# Patient Record
Sex: Male | Born: 1980 | Race: White | Hispanic: No | Marital: Single | Smoking: Current every day smoker
Health system: Southern US, Community
[De-identification: ages and names within clinical notes are randomized; demographics above are authoritative.]

## PROBLEM LIST (undated history)

## (undated) HISTORY — PX: TONSILLECTOMY: SUR1361

## (undated) HISTORY — PX: APPENDECTOMY: SHX54

---

## 2015-06-13 ENCOUNTER — Encounter: Payer: Self-pay | Admitting: Emergency Medicine

## 2015-06-13 ENCOUNTER — Emergency Department
Admission: EM | Admit: 2015-06-13 | Discharge: 2015-06-13 | Disposition: A | Discharge status: Home / Self Care | Payer: BLUE CROSS/BLUE SHIELD | Attending: Emergency Medicine | Admitting: Emergency Medicine

## 2015-06-13 DIAGNOSIS — K644 Residual hemorrhoidal skin tags: Secondary | ICD-10-CM | POA: Diagnosis not present

## 2015-06-13 DIAGNOSIS — Z880 Allergy status to penicillin: Secondary | ICD-10-CM | POA: Insufficient documentation

## 2015-06-13 DIAGNOSIS — Z720 Tobacco use: Secondary | ICD-10-CM | POA: Diagnosis not present

## 2015-06-13 DIAGNOSIS — K649 Unspecified hemorrhoids: Secondary | ICD-10-CM

## 2015-06-13 DIAGNOSIS — K6289 Other specified diseases of anus and rectum: Secondary | ICD-10-CM | POA: Diagnosis present

## 2015-06-13 MED ORDER — LIDOCAINE VISCOUS 2 % MT SOLN
15.0000 mL | Freq: Once | OROMUCOSAL | Status: AC
  Administered 2015-06-13: 15 mL via OROMUCOSAL
  Filled 2015-06-13: qty 15

## 2015-06-13 MED ORDER — HYDROMORPHONE HCL 1 MG/ML IJ SOLN
1.0000 mg | Freq: Once | INTRAMUSCULAR | Status: AC
  Administered 2015-06-13: 1 mg via INTRAMUSCULAR
  Filled 2015-06-13: qty 1

## 2015-06-13 MED ORDER — OXYCODONE-ACETAMINOPHEN 5-325 MG PO TABS
2.0000 | ORAL_TABLET | Freq: Once | ORAL | Status: AC
  Administered 2015-06-13: 2 via ORAL
  Filled 2015-06-13: qty 2

## 2015-06-13 MED ORDER — OXYCODONE-ACETAMINOPHEN 5-325 MG PO TABS: 1.0000 | ORAL_TABLET | Freq: Four times a day (QID) | ORAL | Status: AC | PRN

## 2015-06-13 MED ORDER — LIDOCAINE VISCOUS 2 % MT SOLN: 20.0000 mL | OROMUCOSAL | Status: AC | PRN

## 2015-06-13 NOTE — Discharge Instructions (Signed)
Hemorrhoidectomy Hemorrhoidectomy is surgery to remove hemorrhoids. Hemorrhoids are veins that have become swollen in the rectum. The rectum is the area from the bottom end of the intestines to the opening where bowel movements leave the body. Hemorrhoids can be uncomfortable. They can cause itching, bleeding and pain if a blood clot forms in them (thrombose). If hemorrhoids are small, surgery may not be needed. But if they cover a larger area, surgery is usually suggested.  LET YOUR CAREGIVER KNOW ABOUT:   Any allergies.  All medications you are taking, including:  Herbs, eyedrops, over-the-counter medications and creams.  Blood thinners (anticoagulants), aspirin or other drugs that could affect blood clotting.  Use of steroids (by mouth or as creams).  Previous problems with anesthetics, including local anesthetics.  Possibility of pregnancy, if this applies.  Any history of blood clots.  Any history of bleeding or other blood problems.  Previous surgery.  Smoking history.  Other health problems. RISKS AND COMPLICATIONS All surgery carries some risk. However, hemorrhoid surgery usually goes smoothly. Possible complications could include:  Urinary retention.  Bleeding.  Infection.  A painful incision.  A reaction to the anesthesia (this is not common). BEFORE THE PROCEDURE   Stop using aspirin and non-steroidal anti-inflammatory drugs (NSAIDs) for pain relief. This includes prescription drugs and over-the-counter drugs such as ibuprofen and naproxen. Also stop taking vitamin E. If possible, do this two weeks before your surgery.  If you take blood-thinners, ask your healthcare provider when you should stop taking them.  You will probably have blood and urine tests done several days before your surgery.  Do not eat or drink for about 8 hours before the surgery.  Arrive at least an hour before the surgery, or whenever your surgeon recommends. This will give you time to  check in and fill out any needed paperwork.  Hemorrhoidectomy is often an outpatient procedure. This means you will be able to go home the same day. Sometimes, though, people stay overnight in the hospital after the procedure. Ask your surgeon what to expect. Either way, make arrangements in advance for someone to drive you home. PROCEDURE   The preparation:  You will change into a hospital gown.  You will be given an IV. A needle will be inserted in your arm. Medication can flow directly into your body through this needle.  You might be given an enema to clear your rectum.  Once in the operating room, you will probably lie on your side or be repositioned later to lying on your stomach.  You will be given anesthesia (medication) so you will not feel anything during the surgery. The surgery often is done with local anesthesia (the area near the hemorrhoids will be numb and you will be drowsy but awake). Sometimes, general anesthesia is used (you will be asleep during the procedure).  The procedure:  There are a few different procedures for hemorrhoids. Be sure to ask you surgeon about the procedure, the risks and benefits.  Be sure to ask about what you need to do to take care of the wound, if there is one. AFTER THE PROCEDURE  You will stay in a recovery area until the anesthesia has worn off. Your blood pressure and pulse will be checked every so often.  You may feel a lot of pain in the area of the rectum.  Take all pain medication prescribed by your surgeon. Ask before taking any over-the-counter pain medicines.  Sometimes sitting in a warm bath can help relieve  your pain.  To make sure you have bowel movements without straining:  You will probably need to take stool softeners (usually a pill) for a few days.  You should drink 8 to 10 glasses of water each day.  Your activity will be restricted for awhile. Ask your caregiver for a list of what you should and should not do  while you recover. Document Released: 08/17/2009 Document Revised: 01/12/2012 Document Reviewed: 08/17/2009 [facility] Patient Information 2015 [place], [place]. This information is not intended to replace advice given to you by your health care provider. Make sure you discuss any questions you have with your health care provider.  Hemorrhoid Banding Hemorrhoids are veins in the anus and lower rectum that become enlarged. The most common symptoms are rectal bleeding, itching, and sometimes pain. Hemorrhoids might come out with straining or having a bowel movement, and they can sometimes be pushed back in. There are internal and external hemorrhoids. Only internal hemorrhoids can be treated with banding. In this procedure, a rubber band is placed near the hemorrhoid tissue, cutting off the blood supply. This procedure prevents the hemorrhoids from slipping down. LET YOUR CAREGIVER KNOW ABOUT: All medicines you are taking, especially blood thinners such as aspirin and coumadin.  RISKS AND COMPLICATIONS This is not a painful procedure, but if you do have intense pain immediately let your surgeon know because the band may need to be removed. You may have some mild pain or discomfort in the first 2 days or so after treatment. Sometimes there may be delayed bleeding in the first week after treatment.  BEFORE THE PROCEDURE  There is no special preparation needed before banding. Your surgeon may have you do an enema prior to the procedure. You will go home the same day.  HOME CARE INSTRUCTIONS   Your surgeon might instruct you to do sitz baths as needed if you have discomfort or after a bowel movement.  You may be instructed to use fiber supplements. SEEK MEDICAL CARE IF:  You have an increase in pain.  Your pain does not get better. SEEK IMMEDIATE MEDICAL CARE IF:  You have intense pain.  Fever greater than 100.5 F (38.1 C).  Bleeding that does not stop, or pus from the anus. Document Released:  08/17/2009 Document Revised: 01/12/2012 Document Reviewed: 08/17/2009 [facility] Patient Information 2015 [place], [place]. This information is not intended to replace advice given to you by your health care provider. Make sure you discuss any questions you have with your health care provider.

## 2015-06-13 NOTE — ED Provider Notes (Addendum)
[facility] Emergency Department Provider Note     Time seen: ----------------------------------------- 7:45 AM on 06/13/2015 -----------------------------------------    I have reviewed the triage vital signs and the nursing notes.   HISTORY  Chief Complaint Rectal Pain    HPI Thomas Khan is a 34 y.o. male who presents ER stating he has hemorrhoids cut out Monday at [facility]. Has developed severe rectal pain and groin pain this morning. Patient states she doesn't have any pain medicine because he didn't prescribe him any. Denies fevers chills or other complaints. Hurts to sit down   History reviewed. No pertinent past medical history.  There are no active problems to display for this patient.   Past Surgical History  Procedure Laterality Date  . Appendectomy    . Tonsillectomy      Allergies Penicillins  Social History Social History  Substance Use Topics  . Smoking status: Current Every Day Smoker  . Smokeless tobacco: None  . Alcohol Use: Yes    Review of Systems Constitutional: Negative for fever. Eyes: Negative for visual changes. ENT: Negative for sore throat. Cardiovascular: Negative for chest pain. Respiratory: Negative for shortness of breath. Gastrointestinal: Negative for abdominal pain, vomiting and diarrhea. Positive for rectal pain Genitourinary: Negative for dysuria. Musculoskeletal: Negative for back pain. Skin: Negative for rash. Neurological: Negative for headaches, focal weakness or numbness.  10-point ROS otherwise negative.  ____________________________________________   PHYSICAL EXAM:  VITAL SIGNS: ED Triage Vitals  Enc Vitals Group     BP 06/13/15 0739 136/98 mmHg     Pulse Rate 06/13/15 0739 65     Resp 06/13/15 0739 22     Temp 06/13/15 0739 97.9 F (36.6 C)     Temp Source 06/13/15 0739 Oral     SpO2 06/13/15 0739 97 %     Weight 06/13/15 0739 205 lb (92.987 kg)     Height 06/13/15  0739  (1.778 m)     Head Cir --      Peak Flow --      Pain Score 06/13/15 0741 9     Pain Loc --      Pain Edu? --      Excl. in GC? --     Constitutional: Alert and oriented. Well appearing and in no distress. Eyes: Conjunctivae are normal.  Musculoskeletal: Nontender with normal range of motion in all extremities.  Rectal: Large external hemorrhoids to the right with patient prone, no signs of thrombosis at this time Neurologic:  Normal speech and language. No gross focal neurologic deficits are appreciated. Speech is normal. No gait instability. Skin:  Skin is warm, dry and intact. No rash noted. Psychiatric: Mood and affect are normal. Speech and behavior are normal. Patient exhibits appropriate insight and judgment.  ____________________________________________  ED COURSE:  Pertinent labs & imaging results that were available during my care of the patient were reviewed by me and considered in my medical decision making (see chart for details). Patient received viscous lidocaine to the rectum, will give pain medication. We'll try to review surgical notes from [facility] ____________________________________________     FINAL ASSESSMENT AND PLAN  hemorrhoidal pain  Plan: We'll provide better pain control, he'll be discharged with a topical anesthetic as well as oral pain medicine to take stool softeners.   Emily Filbert, MD   Emily Filbert, MD 06/13/15 1610  Emily Filbert, MD 06/13/15 [phone]

## 2015-06-13 NOTE — ED Notes (Signed)
States he had hemorrhoids cut on Monday at [facility]. Developed severe rectal and groin pain this am

## 2015-06-13 NOTE — ED Notes (Signed)
Nurse Megan in room  with patient at this time, will walk with to lobby for discharge

## 2015-06-13 NOTE — ED Notes (Signed)
NAD noted at time of D/C. Pt refused wheelchair to the lobby due to his inability to sit. Pt states his coworker will meet him in the lobby. Pt ambulated to the lobby with EDT.

## 2021-03-22 ENCOUNTER — Encounter (HOSPITAL_BASED_OUTPATIENT_CLINIC_OR_DEPARTMENT_OTHER): Payer: Self-pay | Admitting: *Deleted

## 2021-03-22 ENCOUNTER — Emergency Department (HOSPITAL_BASED_OUTPATIENT_CLINIC_OR_DEPARTMENT_OTHER): Payer: BC Managed Care – PPO

## 2021-03-22 ENCOUNTER — Emergency Department (HOSPITAL_BASED_OUTPATIENT_CLINIC_OR_DEPARTMENT_OTHER)
Admission: EM | Admit: 2021-03-22 | Discharge: 2021-03-22 | Disposition: A | Discharge status: Home / Self Care | Payer: BC Managed Care – PPO | Attending: Emergency Medicine | Admitting: Emergency Medicine

## 2021-03-22 ENCOUNTER — Other Ambulatory Visit: Payer: Self-pay

## 2021-03-22 DIAGNOSIS — M542 Cervicalgia: Secondary | ICD-10-CM | POA: Diagnosis not present

## 2021-03-22 DIAGNOSIS — R079 Chest pain, unspecified: Secondary | ICD-10-CM | POA: Diagnosis not present

## 2021-03-22 DIAGNOSIS — J029 Acute pharyngitis, unspecified: Secondary | ICD-10-CM | POA: Insufficient documentation

## 2021-03-22 DIAGNOSIS — F17200 Nicotine dependence, unspecified, uncomplicated: Secondary | ICD-10-CM | POA: Diagnosis not present

## 2021-03-22 DIAGNOSIS — R200 Anesthesia of skin: Secondary | ICD-10-CM | POA: Diagnosis not present

## 2021-03-22 LAB — CBC WITH DIFFERENTIAL/PLATELET
Abs Immature Granulocytes: 0.02 10*3/uL (ref 0.00–0.07)
Basophils Absolute: 0.1 10*3/uL (ref 0.0–0.1)
Basophils Relative: 1 %
Eosinophils Absolute: 0.1 10*3/uL (ref 0.0–0.5)
Eosinophils Relative: 2 %
HCT: 47.1 % (ref 39.0–52.0)
Hemoglobin: 15.4 g/dL (ref 13.0–17.0)
Immature Granulocytes: 0 %
Lymphocytes Relative: 43 %
Lymphs Abs: 3.3 10*3/uL (ref 0.7–4.0)
MCH: 30 pg (ref 26.0–34.0)
MCHC: 32.7 g/dL (ref 30.0–36.0)
MCV: 91.6 fL (ref 80.0–100.0)
Monocytes Absolute: 0.4 10*3/uL (ref 0.1–1.0)
Monocytes Relative: 5 %
Neutro Abs: 3.8 10*3/uL (ref 1.7–7.7)
Neutrophils Relative %: 49 %
Platelets: 232 10*3/uL (ref 150–400)
RBC: 5.14 MIL/uL (ref 4.22–5.81)
RDW: 13.1 % (ref 11.5–15.5)
WBC: 7.7 10*3/uL (ref 4.0–10.5)
nRBC: 0 % (ref 0.0–0.2)

## 2021-03-22 LAB — BASIC METABOLIC PANEL
Anion gap: 9 (ref 5–15)
BUN: 17 mg/dL (ref 6–20)
CO2: 23 mmol/L (ref 22–32)
Calcium: 9.1 mg/dL (ref 8.9–10.3)
Chloride: 106 mmol/L (ref 98–111)
Creatinine, Ser: 0.98 mg/dL (ref 0.61–1.24)
GFR, Estimated: >60 mL/min (ref >60)
Glucose, Bld: 94 mg/dL (ref 70–99)
Potassium: 3.6 mmol/L (ref 3.5–5.1)
Sodium: 138 mmol/L (ref 135–145)

## 2021-03-22 LAB — TSH: TSH: 0.916 u[IU]/mL (ref 0.350–4.500)

## 2021-03-22 NOTE — ED Provider Notes (Signed)
[facility] EMERGENCY DEPARTMENT Provider Note   CSN: [id number] Arrival date & time: 03/22/21  1445     History Chief Complaint  Patient presents with  . Ear Pain    Thomas Khan is a 40 y.o. male.  [name] presented for definitive diagnosis of a complaint that has been ongoing for 2 years.  He attributes the beginning of his symptoms to March 2020 when he had a lower respiratory illness and significant coughing.  1 day he began to have pain on the right side of his jaw, neck, chest.  At this point, he feels that he has a BB in the side of his right neck and has numbness on the right side when he swallows.  He has seen his primary care doctor, GI, and ENT.  He has had endoscopy as well as a nasopharyngeal scope exam but states that ENT could not pass the scope down the right side of his neck.  Currently, he says that the pain has been worse for about 2 days.  The history is provided by the patient.  Sore Throat This is a chronic problem. Episode onset: March 2020. The problem occurs constantly. The problem has not changed since onset.Associated symptoms include chest pain (at right anterior chest). Pertinent negatives include no abdominal pain, no headaches and no shortness of breath. The symptoms are aggravated by swallowing and twisting. Nothing relieves the symptoms. He has tried nothing for the symptoms. The treatment provided no relief.       History reviewed. No pertinent past medical history.  There are no problems to display for this patient.   Past Surgical History:  Procedure Laterality Date  . APPENDECTOMY    . TONSILLECTOMY         No family history on file.  Social History   Tobacco Use  . Smoking status: Current Every Day Smoker  Substance Use Topics  . Alcohol use: Yes    Home Medications Prior to Admission medications   Medication Sig Start Date End Date Taking? Authorizing Provider  lidocaine (XYLOCAINE) 2 % solution Use as  directed 20 mLs in the mouth or throat as needed. To be applied rectally as needed for pain 06/13/15   Emily Filbert, MD  oxyCODONE-acetaminophen (ROXICET) 5-325 MG per tablet Take 1 tablet by mouth every 6 (six) hours as needed. 06/13/15   Emily Filbert, MD    Allergies    Penicillins  Review of Systems   Review of Systems  Constitutional: Negative for chills and fever.  HENT: Negative for ear pain and sore throat.   Eyes: Negative for pain and visual disturbance.  Respiratory: Negative for cough and shortness of breath.   Cardiovascular: Positive for chest pain (at right anterior chest). Negative for palpitations.  Gastrointestinal: Negative for abdominal pain and vomiting.  Genitourinary: Negative for dysuria and hematuria.  Musculoskeletal: Negative for arthralgias and back pain.  Skin: Negative for color change and rash.  Neurological: Negative for seizures, syncope and headaches.  All other systems reviewed and are negative.   Physical Exam Updated Vital Signs BP (!) 158/107 (BP Location: Left Arm)   Pulse 70   Temp 98.6 F (37 C) (Oral)   Resp 18   Ht 5\' 10"  (1.778 m)   Wt 108.9 kg   SpO2 95%   BMI 34.44 kg/m   Physical Exam Vitals and nursing note reviewed.  Constitutional:      Appearance: Normal appearance.  HENT:     Head:  Normocephalic and atraumatic.     Right Ear: Tympanic membrane, ear canal and external ear normal.     Left Ear: Tympanic membrane, ear canal and external ear normal.     Nose: Nose normal.     Mouth/Throat:     Mouth: Mucous membranes are moist.     Pharynx: Oropharynx is clear. No oropharyngeal exudate or posterior oropharyngeal erythema.     Comments: No paralysis of the posterior pharynx Voice normal No dysarthria Eyes:     Extraocular Movements: Extraocular movements intact.     Conjunctiva/sclera: Conjunctivae normal.     Pupils: Pupils are equal, round, and reactive to light.  Neck:     Comments: Possible mild  fullness on the right side of the neck without discrete fullness. No supraclavicular LAD Cardiovascular:     Comments: Chest wall normal to inspection Pulmonary:     Effort: Pulmonary effort is normal. No respiratory distress.  Musculoskeletal:        General: No deformity. Normal range of motion.     Cervical back: Normal range of motion. Tenderness present.     Comments: Shoulder ROM full and painless  Lymphadenopathy:     Cervical: No cervical adenopathy.  Skin:    General: Skin is warm and dry.  Neurological:     General: No focal deficit present.     Mental Status: He is alert and oriented to person, place, and time. Mental status is at baseline.  Psychiatric:        Mood and Affect: Mood normal.     ED Results / Procedures / Treatments   Labs (all labs ordered are listed, but only abnormal results are displayed) Labs Reviewed  BASIC METABOLIC PANEL  CBC WITH DIFFERENTIAL/PLATELET  TSH    EKG None  Radiology CT Head Wo Contrast  Result Date: 03/22/2021 CLINICAL DATA:  Right ear and neck pain EXAM: CT HEAD WITHOUT CONTRAST TECHNIQUE: Contiguous axial images were obtained from the base of the skull through the vertex without intravenous contrast. COMPARISON:  CT brain 03/25/2017 FINDINGS: Brain: No evidence of acute infarction, hemorrhage, hydrocephalus, extra-axial collection or mass lesion/mass effect. Vascular: No hyperdense vessel or unexpected calcification. Skull: Normal. Negative for fracture or focal lesion. Sinuses/Orbits: No acute finding. Other: None IMPRESSION: Negative non contrasted CT appearance of the brain Electronically Signed   By: Jasmine Pang M.D.   On: 03/22/2021 16:57   CT Soft Tissue Neck Wo Contrast  Result Date: 03/22/2021 CLINICAL DATA:  Right ear and right neck pain over the last 2 days. Painful swallowing. EXAM: CT NECK WITHOUT CONTRAST TECHNIQUE: Multidetector CT imaging of the neck was performed following the standard protocol without  intravenous contrast. COMPARISON:  None. FINDINGS: Pharynx and larynx: No visible mucosal or submucosal mass. Without contrast, the study might be somewhat less sensitive to tonsillitis or tonsillar inflammation, but I do not see evidence of that on this exam. Salivary glands: Parotid and submandibular glands are normal. Thyroid: Normal Lymph nodes: Normal cervical chain nodes on both sides of the neck. No lymphadenopathy. Vascular: No abnormal vascular finding on this noncontrast study. Limited intracranial: Normal Visualized orbits: Normal Mastoids and visualized paranasal sinuses: Clear. Skeleton: Normal.  No temporal bone abnormality identified. Upper chest: Normal Other: None IMPRESSION: Normal noncontrast exam. No abnormality seen to explain right neck pain or right ear pain. Electronically Signed   By: Paulina Fusi M.D.   On: 03/22/2021 16:52    Procedures Procedures   Medications Ordered in ED Medications -  No data to display  ED Course  I have reviewed the triage vital signs and the nursing notes.  Pertinent labs & imaging results that were available during my care of the patient were reviewed by me and considered in my medical decision making (see chart for details).  This patient was evaluated during a time of global shortage of iodinated contrast media. Based on guidance from the [employer] of Radiology, best practices, and local institutional approaches an alternative path for evaluating and managing the patient may have been employed in order to provide optimal care during this shortage. The current situation has been discussed with the patient   MDM Rules/Calculators/A&P                          Tyrone Pautsch presents with 2 years of right anterior neck pain which also involves a variety of other body areas including his right anterior chest, right arm, and right scapular area.  He is requesting diagnostic imaging.  He has seen several other specialist for a similar  complaint.  I told the patient that it was possible we would not come up with a definitive diagnosis for him.  Some of his pain seems potentially neuropathic in etiology.  However, I was able to perform diagnostic imaging.  No evidence of brain pathology.  I did a CT head in order to make sure there was no obvious brainstem pathology.  Some of his symptoms could possibly be related to cranial nerves.  Next, the patient had a CT neck which was negative for any acute findings.  I think he may benefit from neurological consultation to evaluate for any nerve testing options.  He will be discharged home. Final Clinical Impression(s) / ED Diagnoses Final diagnoses:  Neck pain    Rx / DC Orders ED Discharge Orders    None       Koleen Distance, MD 03/22/21 [phone]

## 2021-03-22 NOTE — ED Triage Notes (Addendum)
C/o right ear pain to right neck pain x 2 days hx of same, requesting CT neck and throat

## 2021-05-03 ENCOUNTER — Emergency Department (HOSPITAL_BASED_OUTPATIENT_CLINIC_OR_DEPARTMENT_OTHER)
Admission: EM | Admit: 2021-05-03 | Discharge: 2021-05-03 | Disposition: A | Discharge status: Home / Self Care | Payer: BC Managed Care – PPO | Attending: Emergency Medicine | Admitting: Emergency Medicine

## 2021-05-03 ENCOUNTER — Other Ambulatory Visit: Payer: Self-pay

## 2021-05-03 ENCOUNTER — Encounter (HOSPITAL_BASED_OUTPATIENT_CLINIC_OR_DEPARTMENT_OTHER): Payer: Self-pay | Admitting: *Deleted

## 2021-05-03 DIAGNOSIS — F17210 Nicotine dependence, cigarettes, uncomplicated: Secondary | ICD-10-CM | POA: Diagnosis not present

## 2021-05-03 DIAGNOSIS — U071 COVID-19: Secondary | ICD-10-CM

## 2021-05-03 DIAGNOSIS — R509 Fever, unspecified: Secondary | ICD-10-CM | POA: Diagnosis present

## 2021-05-03 LAB — RESP PANEL BY RT-PCR (FLU A&B, COVID) ARPGX2
Influenza A by PCR: NEGATIVE
Influenza B by PCR: NEGATIVE
SARS Coronavirus 2 by RT PCR: POSITIVE — AB

## 2021-05-03 MED ORDER — NIRMATRELVIR/RITONAVIR (PAXLOVID)TABLET: 3.0000 | ORAL_TABLET | Freq: Two times a day (BID) | ORAL | 0 refills | Status: AC

## 2021-05-03 MED ORDER — BENZONATATE 100 MG PO CAPS: 100.0000 mg | ORAL_CAPSULE | Freq: Three times a day (TID) | ORAL | 0 refills | Status: AC

## 2021-05-03 MED ORDER — IBUPROFEN 800 MG PO TABS
800.0000 mg | ORAL_TABLET | Freq: Once | ORAL | Status: AC
  Administered 2021-05-03: 800 mg via ORAL
  Filled 2021-05-03: qty 1

## 2021-05-03 MED ORDER — ONDANSETRON 4 MG PO TBDP: ORAL_TABLET | ORAL | 0 refills | Status: AC

## 2021-05-03 NOTE — ED Notes (Signed)
Pt verbalized understanding of discharge instructions. Prescriptions reviewed and sent to pharmacy. Work note provided.

## 2021-05-03 NOTE — ED Triage Notes (Signed)
C/o covid sx x 2 days , h/a body aches , fever

## 2021-05-03 NOTE — ED Provider Notes (Signed)
[facility] EMERGENCY DEPARTMENT Provider Note   CSN: [id number] Arrival date & time: 05/03/21  1745     History Chief Complaint  Patient presents with   covid sx    Thomas Khan is a 40 y.o. male.  40 yo M with a chief complaints of fevers chills headache going on since yesterday.  Tells me he has been taking things at home for fever but has not improved much.  Denies any difficulty breathing.  Denies nausea vomiting or diarrhea.  The history is provided by the patient.  Illness Severity:  Moderate Onset quality:  Gradual Duration:  1 day Timing:  Constant Progression:  Worsening Chronicity:  New Associated symptoms: fever, headaches and myalgias   Associated symptoms: no abdominal pain, no chest pain, no congestion, no diarrhea, no rash, no shortness of breath and no vomiting       History reviewed. No pertinent past medical history.  There are no problems to display for this patient.   Past Surgical History:  Procedure Laterality Date   APPENDECTOMY     TONSILLECTOMY         No family history on file.  Social History   Tobacco Use   Smoking status: Every Day    Packs/day: 1.00    Pack years: 0.00    Types: Cigarettes   Smokeless tobacco: Never  Substance Use Topics   Alcohol use: Yes    Home Medications Prior to Admission medications   Medication Sig Start Date End Date Taking? Authorizing Provider  benzonatate (TESSALON) 100 MG capsule Take 1 capsule (100 mg total) by mouth every 8 (eight) hours. 05/03/21  Yes Melene Plan, DO  nirmatrelvir/ritonavir EUA (PAXLOVID) TABS Take 3 tablets by mouth 2 (two) times daily for 5 days. Take nirmatrelvir (150 mg) two tablets twice daily for 5 days and ritonavir (100 mg) one tablet twice daily for 5 days. 05/03/21 05/08/21 Yes Melene Plan, DO  ondansetron (ZOFRAN ODT) 4 MG disintegrating tablet 4mg  ODT q4 hours prn nausea/vomit 05/03/21  Yes 07/04/21, DO  lidocaine (XYLOCAINE) 2 % solution Use as directed 20  mLs in the mouth or throat as needed. To be applied rectally as needed for pain 06/13/15   08/13/15, MD  oxyCODONE-acetaminophen (ROXICET) 5-325 MG per tablet Take 1 tablet by mouth every 6 (six) hours as needed. 06/13/15   08/13/15, MD    Allergies    Penicillins  Review of Systems   Review of Systems  Constitutional:  Positive for chills and fever.  HENT:  Negative for congestion and facial swelling.   Eyes:  Negative for discharge and visual disturbance.  Respiratory:  Negative for shortness of breath.   Cardiovascular:  Negative for chest pain and palpitations.  Gastrointestinal:  Negative for abdominal pain, diarrhea and vomiting.  Musculoskeletal:  Positive for myalgias. Negative for arthralgias.  Skin:  Negative for color change and rash.  Neurological:  Positive for headaches. Negative for tremors and syncope.  Psychiatric/Behavioral:  Negative for confusion and dysphoric mood.    Physical Exam Updated Vital Signs BP 127/75 (BP Location: Right Arm)   Pulse 66   Temp (!) 101.9 F (38.8 C)   Resp 18   Ht 5\' 10"  (1.778 m)   Wt 106.6 kg   SpO2 97%   BMI 33.72 kg/m   Physical Exam Vitals and nursing note reviewed.  Constitutional:      Appearance: He is well-developed.  HENT:     Head: Normocephalic and  atraumatic.  Eyes:     Pupils: Pupils are equal, round, and reactive to light.  Neck:     Vascular: No JVD.  Cardiovascular:     Rate and Rhythm: Normal rate and regular rhythm.     Heart sounds: No murmur heard.   No friction rub. No gallop.  Pulmonary:     Effort: No respiratory distress.     Breath sounds: No wheezing.  Abdominal:     General: There is no distension.     Tenderness: There is no abdominal tenderness. There is no guarding or rebound.  Musculoskeletal:        General: Normal range of motion.     Cervical back: Normal range of motion and neck supple.  Skin:    Coloration: Skin is not pale.     Findings: No rash.   Neurological:     Mental Status: He is alert and oriented to person, place, and time.  Psychiatric:        Behavior: Behavior normal.    ED Results / Procedures / Treatments   Labs (all labs ordered are listed, but only abnormal results are displayed) Labs Reviewed  RESP PANEL BY RT-PCR (FLU A&B, COVID) ARPGX2 - Abnormal; Notable for the following components:      Result Value   SARS Coronavirus 2 by RT PCR POSITIVE (*)    All other components within normal limits    EKG None  Radiology No results found.  Procedures Procedures   Medications Ordered in ED Medications  ibuprofen (ADVIL) tablet 800 mg (800 mg Oral Given 05/03/21 1757)    ED Course  I have reviewed the triage vital signs and the nursing notes.  Pertinent labs & imaging results that were available during my care of the patient were reviewed by me and considered in my medical decision making (see chart for details).    MDM Rules/Calculators/A&P                          40 yo M with a chief complaints of fevers chills muscle aches headache going on since yesterday.  COVID test positive.  Not hypoxic.  He is concerned mostly that his fever has not come down.  Discussed typical course of the coronavirus.  He is considered high risk based on his BMI.  Will start on Paxlovid.  Symptomatic therapy.  PCP follow-up.  7:02 PM:  I have discussed the diagnosis/risks/treatment options with the patient and believe the pt to be eligible for discharge home to follow-up with PCP. We also discussed returning to the ED immediately if new or worsening sx occur. We discussed the sx which are most concerning (e.g., sudden worsening pain, fever, inability to tolerate by mouth) that necessitate immediate return. Medications administered to the patient during their visit and any new prescriptions provided to the patient are listed below.  Medications given during this visit Medications  ibuprofen (ADVIL) tablet 800 mg (800 mg Oral  Given 05/03/21 1757)     The patient appears reasonably screen and/or stabilized for discharge and I doubt any other medical condition or other [facility] requiring further screening, evaluation, or treatment in the ED at this time prior to discharge.   Final Clinical Impression(s) / ED Diagnoses Final diagnoses:  COVID-19 virus infection    Rx / DC Orders ED Discharge Orders          Ordered    nirmatrelvir/ritonavir EUA (PAXLOVID) TABS  2 times daily  05/03/21 1858    benzonatate (TESSALON) 100 MG capsule  Every 8 hours        05/03/21 1858    ondansetron (ZOFRAN ODT) 4 MG disintegrating tablet        05/03/21 1858             Melene Plan, DO 05/03/21 1902

## 2021-05-03 NOTE — ED Notes (Signed)
Patient c/o 10/10 headache, body aches, fever, denies cough, sob, n/v/d.  Fever for 2 days, patient last took tylenol at 1500.

## 2021-12-19 IMAGING — CT CT NECK W/O CM
3 of 4 series · 13 of 33 positions shown, 16 images · non-contrast
Comparison: None.

CLINICAL DATA: Right ear and right neck pain over the last 2 days.
Painful swallowing.

EXAM:
CT NECK WITHOUT CONTRAST
TECHNIQUE: Multidetector CT imaging of the neck was performed following the
standard protocol without intravenous contrast.

[Series 5: sag neck · sagittal · 0.48mm/px · 5 of 101 slices shown, 6 images]
[im 34/101  bone]
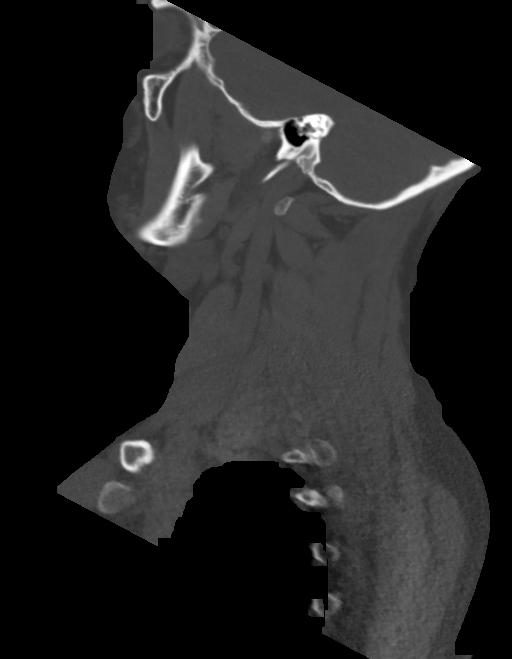
[im 42/101  bone]
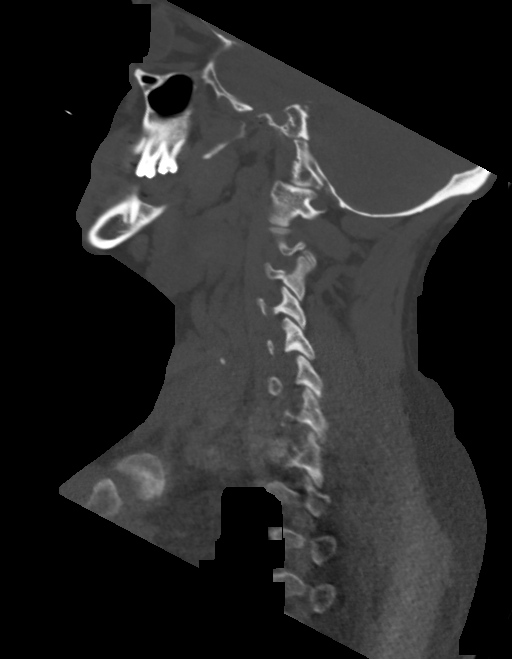
[im 51/101  soft-tissue]
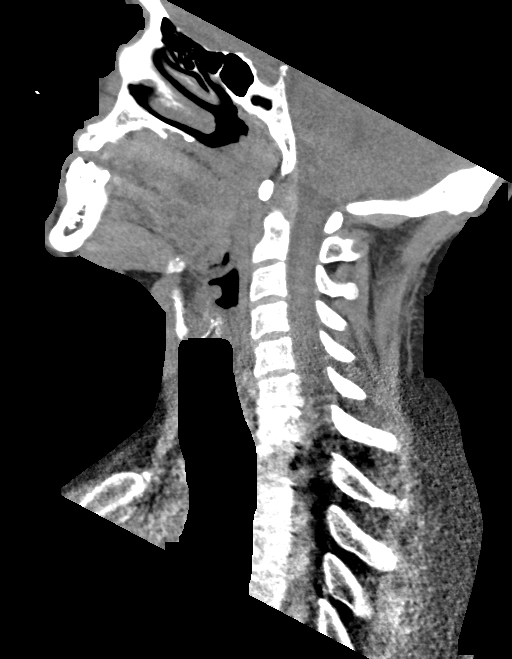
[im 51/101  bone]
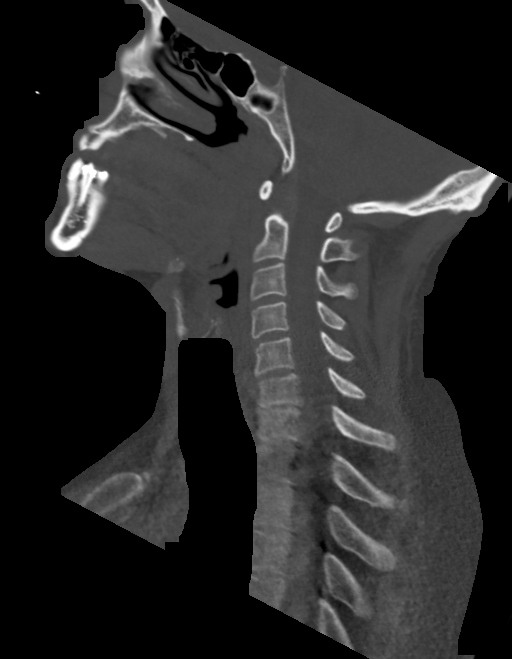
[im 59/101  bone]
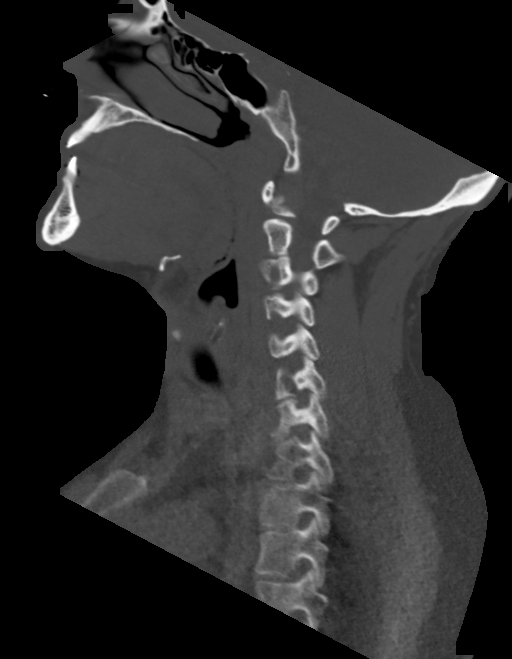
[im 67/101  bone]
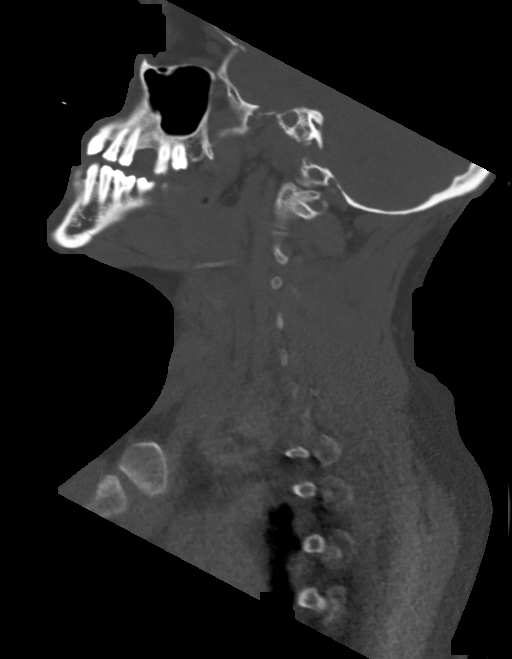

[Series 6: cor neck · coronal · 0.44mm/px · 3 of 117 slices shown]
[im 43/117  bone]
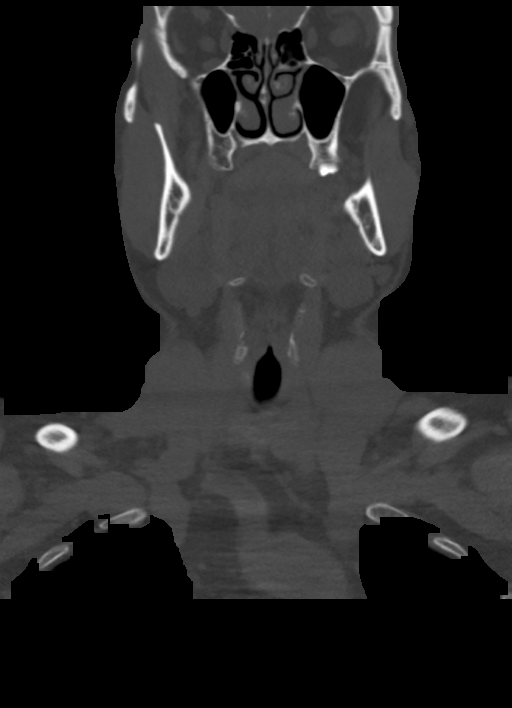
[im 53/117  bone]
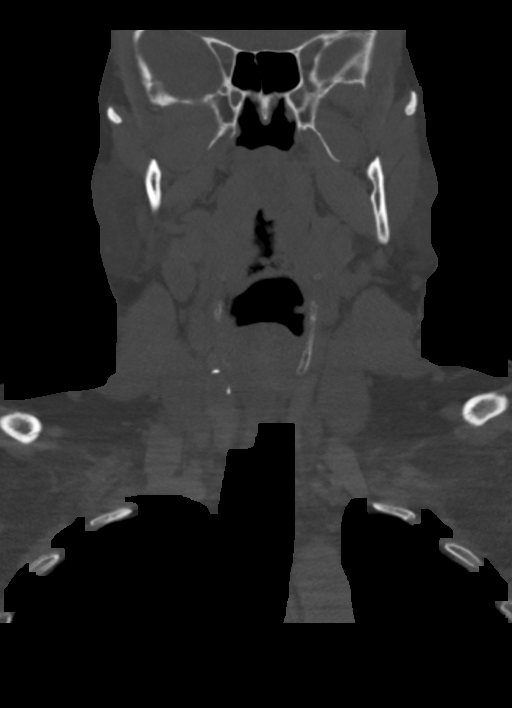
[im 64/117  bone]
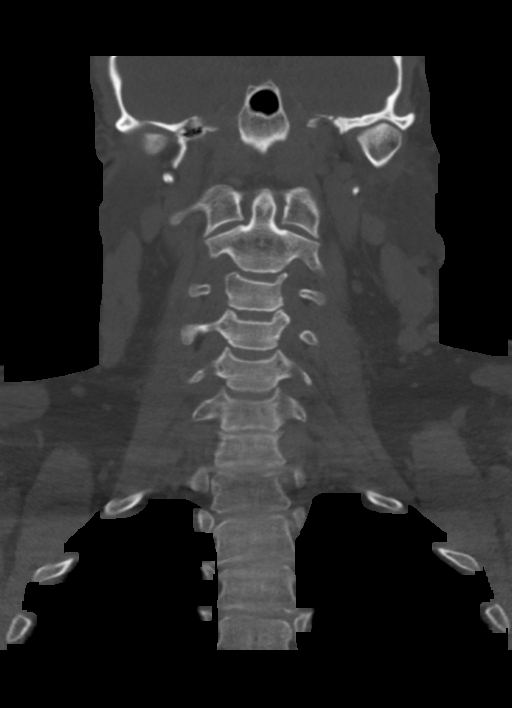

[Series 7: orthogonal ax · axial · 0.43mm/px · z∈[-316,-117]mm · 5 of 158 slices shown, 7 images]
[im 23/158  soft-tissue]
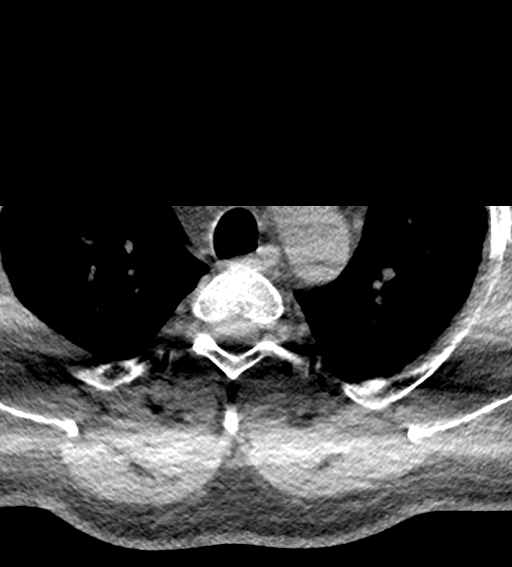
[im 23/158  bone]
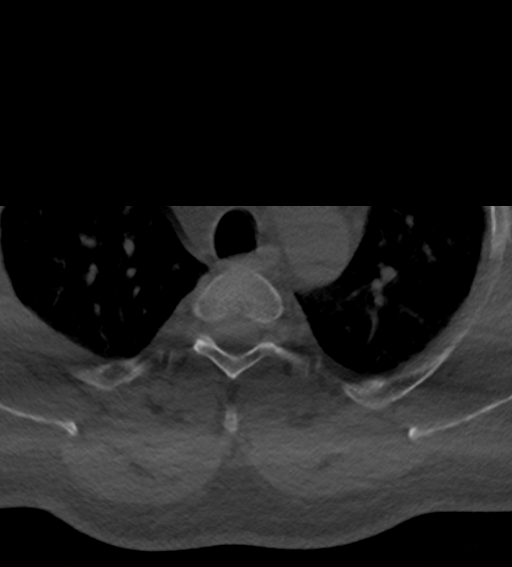
[im 45/158  bone]
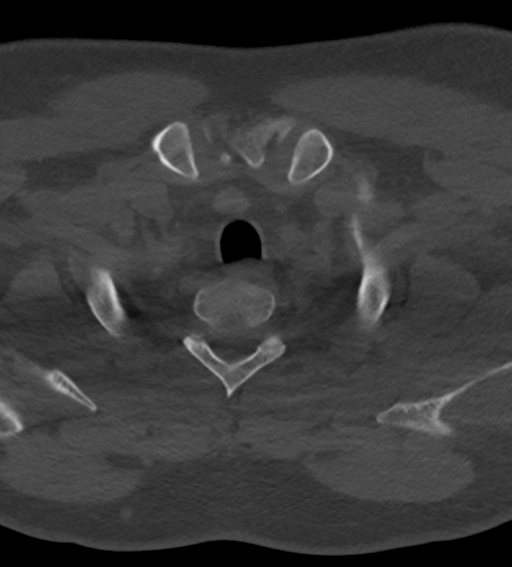
[im 90/158  bone]
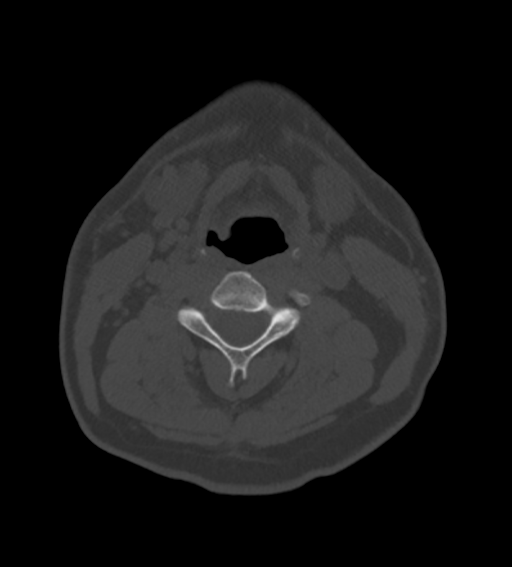
[im 113/158  bone]
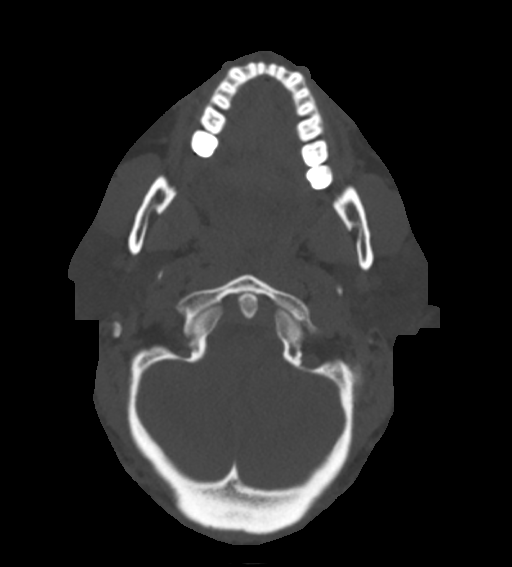
[im 135/158  soft-tissue]
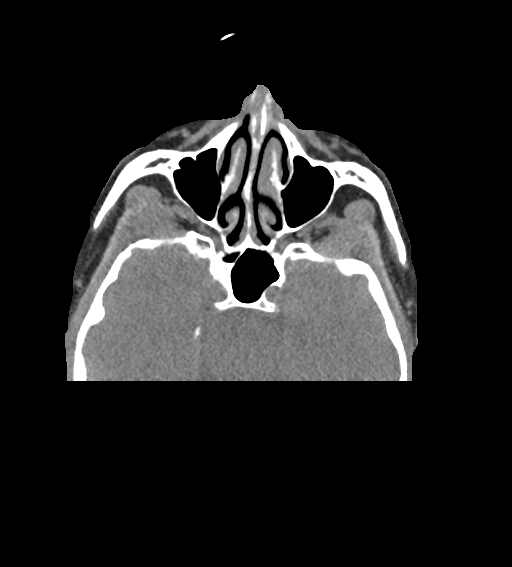
[im 135/158  bone]
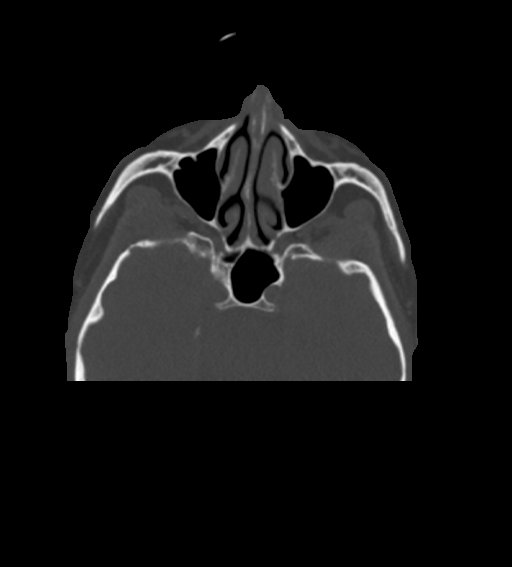

[13 of 33 positions shown; findings below may reference images not displayed]

FINDINGS: Pharynx and larynx: No visible mucosal or submucosal mass. Without
contrast, the study might be somewhat less sensitive to tonsillitis
or tonsillar inflammation, but I do not see evidence of that on this
exam.

Salivary glands: Parotid and submandibular glands are normal.

Thyroid: Normal

Lymph nodes: Normal cervical chain nodes on both sides of the neck.
No lymphadenopathy.

Vascular: No abnormal vascular finding on this noncontrast study.

Limited intracranial: Normal

Visualized orbits: Normal

Mastoids and visualized paranasal sinuses: Clear.

Skeleton: Normal.  No temporal bone abnormality identified.

Upper chest: Normal

Other: None
IMPRESSION: Normal noncontrast exam. No abnormality seen to explain right neck
pain or right ear pain.
